# Patient Record
Sex: Male | Born: 1995 | Hispanic: No | Marital: Single
Health system: Southern US, Community
[De-identification: ages and names within clinical notes are randomized; demographics above are authoritative.]

---
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
## 2018-02-13 IMAGING — MR MR KNEE*L* W/O CM
6 series · 40 of 40 positions shown · non-contrast
Comparison: None.

CLINICAL DATA: Weakness, dull pain in the whole knee. Pain with
flexion. No prior surgery.

EXAM:
MRI OF THE LEFT KNEE WITHOUT CONTRAST
TECHNIQUE: Multiplanar, multisequence MR imaging of the knee was performed. No
intravenous contrast was administered.

[Series 3: PD · axial · 4.0mm · 0.62mm/px · z∈[-71,+68]mm · 9 of 30 slices shown (1 of 2)]
[im 1/30]
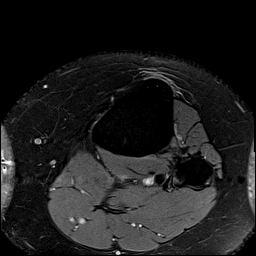
[im 4/30]
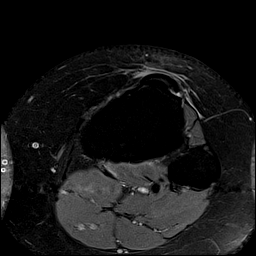
[im 8/30]
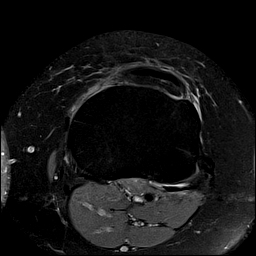
[im 11/30]
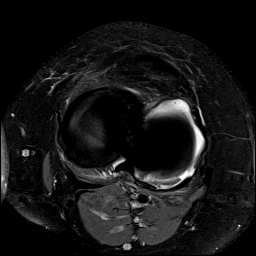
[im 15/30]
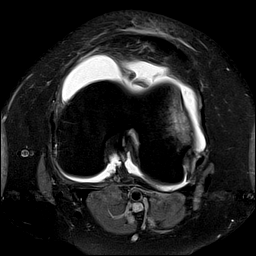
[im 19/30]
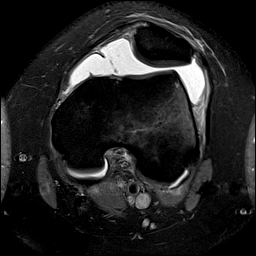
[im 22/30]
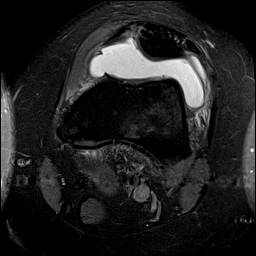
[im 26/30]
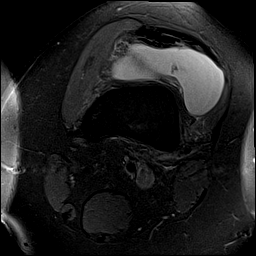
[im 30/30]
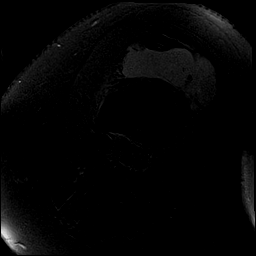

[Series 4: PD fat-sat · sagittal · 4.0mm · 0.62mm/px · 7 of 26 slices shown (1 of 2)]
[im 1/26]
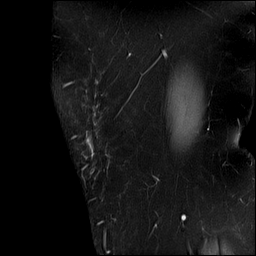
[im 5/26]
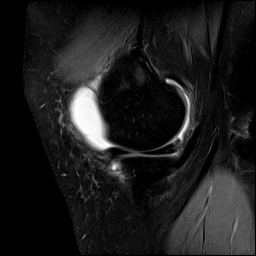
[im 9/26]
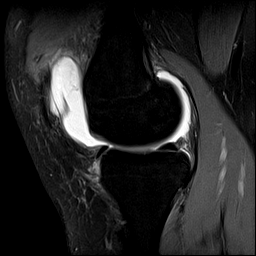
[im 13/26]
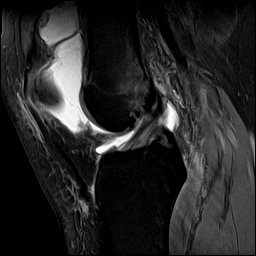
[im 17/26]
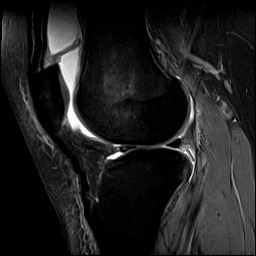
[im 21/26]
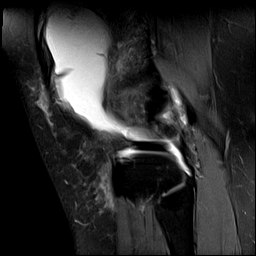
[im 26/26]
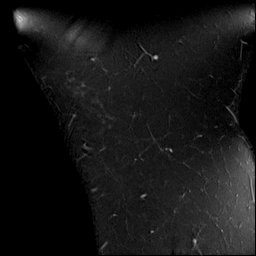

[Series 5: PD · coronal · 4.0mm · 0.66mm/px · 7 of 24 slices shown (2 of 2)]
[im 1/24]
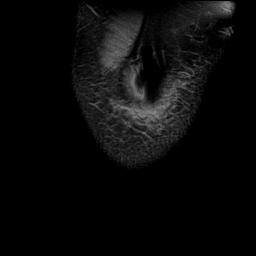
[im 4/24]
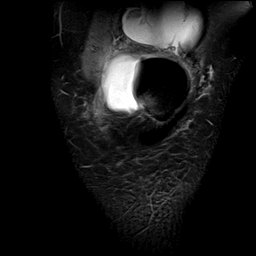
[im 8/24]
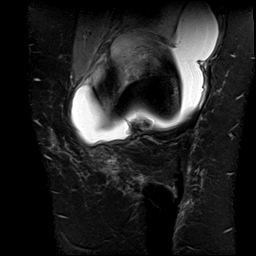
[im 12/24]
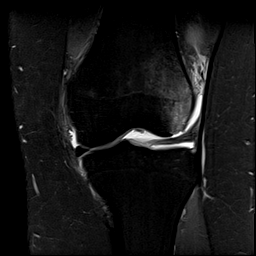
[im 16/24]
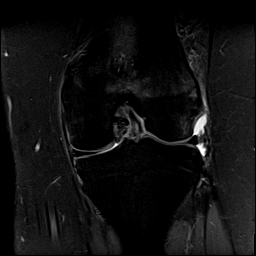
[im 20/24]
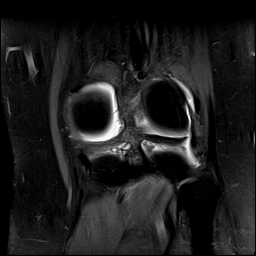
[im 24/24]
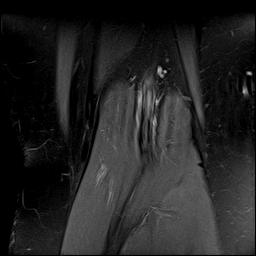

[Series 6: (id) · coronal · 4.0mm · 0.66mm/px · 7 of 24 slices shown]
[im 1/24]
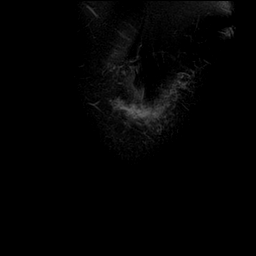
[im 4/24]
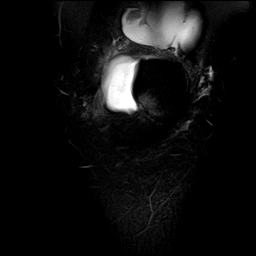
[im 8/24]
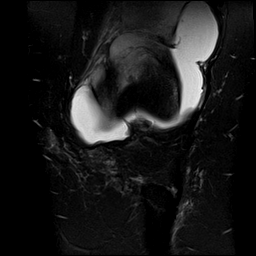
[im 12/24]
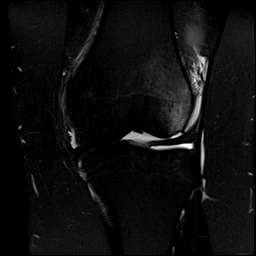
[im 16/24]
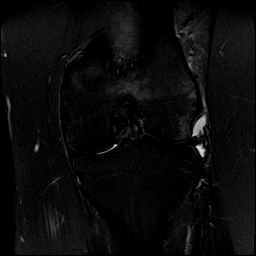
[im 20/24]
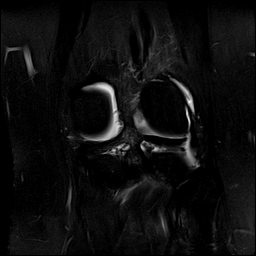
[im 24/24]
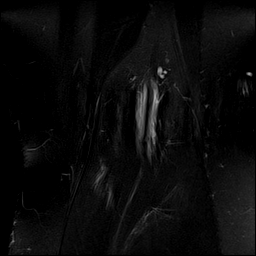

[Series 7: T1 · coronal · 4.0mm · 0.66mm/px · 7 of 24 slices shown]
[im 1/24]
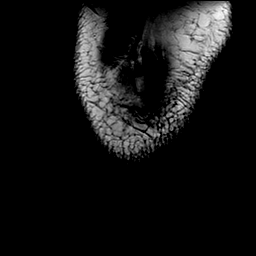
[im 4/24]
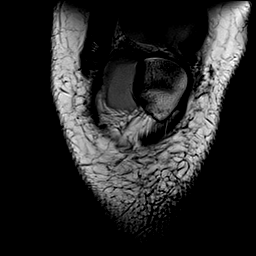
[im 8/24]
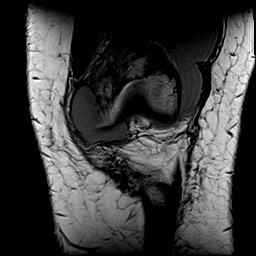
[im 12/24]
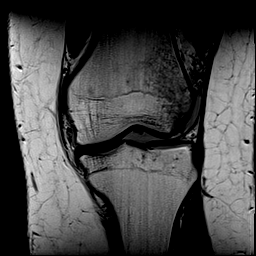
[im 16/24]
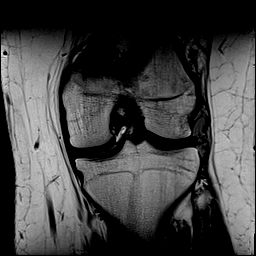
[im 20/24]
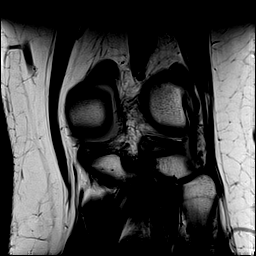
[im 24/24]
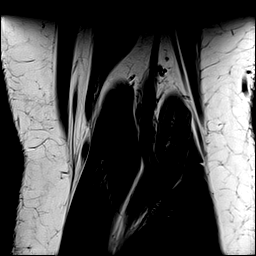

[Series 8: PD fat-sat · coronal · 2.0mm · 0.59mm/px · 3 of 11 slices shown (2 of 2)]
[im 1/11]
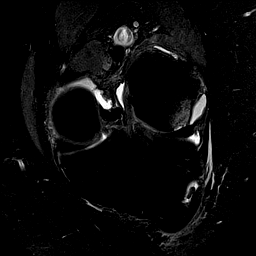
[im 6/11]
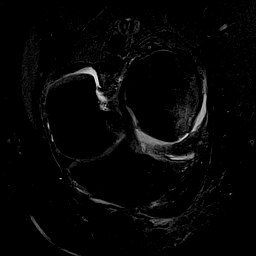
[im 11/11]
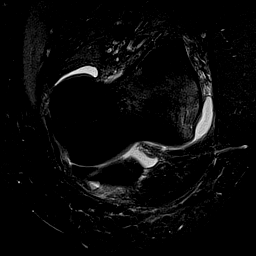

[40 of 40 positions shown; findings below may reference images not displayed]

FINDINGS: MENISCI

Medial meniscus:  Intact.

Lateral meniscus:  Intact.

LIGAMENTS

Cruciates:  Intact ACL and PCL.

Collaterals: Medial collateral ligament is intact. Lateral
collateral ligament complex is intact.

CARTILAGE

Patellofemoral:  No focal chondral defect.

Medial:  No focal chondral defect.

Lateral: Osteochondral fracture of the periphery of the
anterolateral femoral condyle measuring 10 x 25 mm with severe
adjacent marrow edema. Osteochondral fracture primarily involves
cartilage and a small amount of bone. Primary osseous abnormality is
an impaction fracture.

Joint: Large joint effusion. Normal Hoffa's fat. No plical
thickening.

Popliteal Fossa:  Intact popliteus tendon.  No Baker cyst.

Extensor Mechanism: Intact quadriceps tendon and patellar tendon.
Lateral patellar tilting. TT -TG distance 15 mm. Increased signal
and indistinctness of the patellar insertion of the medial
retinaculum and MPFL consistent with injury without complete
disruption. No stripping of the VMO.

Bones:  No other marrow signal abnormality.

Other: No fluid collection or hematoma.
IMPRESSION: 1. Findings most consistent with transient lateral patellar
dislocation.
2. Osteochondral fracture of the periphery of the anterolateral
femoral condyle measuring 10 x 25 mm with severe adjacent marrow
edema. Osteochondral fracture primarily involves cartilage and a
small amount of bone. Primary osseous abnormality is an impaction
fracture.
3. Lateral patellar tilting. TT -TG distance 15 mm. Increased signal
and indistinctness of the patellar insertion of the medial
retinaculum and MPFL consistent with injury without complete
disruption. No stripping of the VMO.
4. Large joint effusion.

## 2020-10-31 ENCOUNTER — Ambulatory Visit: Payer: Self-pay | Attending: Critical Care Medicine

## 2020-10-31 DIAGNOSIS — Z20822 Contact with and (suspected) exposure to covid-19: Secondary | ICD-10-CM

## 2020-11-01 LAB — NOVEL CORONAVIRUS, NAA: SARS-CoV-2, NAA: NOT DETECTED

## 2020-11-01 LAB — SARS-COV-2, NAA 2 DAY TAT

## 2020-11-03 ENCOUNTER — Telehealth: Payer: Self-pay

## 2020-11-03 NOTE — Telephone Encounter (Signed)
This encounter was created in error - please disregard.

## 2020-11-03 NOTE — Telephone Encounter (Signed)
